# Patient Record
Sex: Female | Born: 1995 | Race: White | Hispanic: No | Marital: Single | Smoking: Never smoker
Health system: Southern US, Community
[De-identification: ages and names within clinical notes are randomized; demographics above are authoritative.]

## PROBLEM LIST (undated history)

## (undated) DIAGNOSIS — F419 Anxiety disorder, unspecified: Secondary | ICD-10-CM

## (undated) HISTORY — PX: NO PAST SURGERIES: SHX2092

---
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
## 2015-12-30 ENCOUNTER — Emergency Department: Payer: 59

## 2015-12-30 ENCOUNTER — Encounter: Payer: Self-pay | Admitting: Emergency Medicine

## 2015-12-30 ENCOUNTER — Observation Stay
Admission: EM | Admit: 2015-12-30 | Discharge: 2015-12-31 | Disposition: A | Discharge status: Home / Self Care | Payer: 59 | Attending: Internal Medicine | Admitting: Internal Medicine

## 2015-12-30 DIAGNOSIS — R05 Cough: Secondary | ICD-10-CM | POA: Insufficient documentation

## 2015-12-30 DIAGNOSIS — R079 Chest pain, unspecified: Secondary | ICD-10-CM | POA: Diagnosis not present

## 2015-12-30 DIAGNOSIS — F419 Anxiety disorder, unspecified: Secondary | ICD-10-CM | POA: Insufficient documentation

## 2015-12-30 DIAGNOSIS — R1012 Left upper quadrant pain: Secondary | ICD-10-CM | POA: Insufficient documentation

## 2015-12-30 DIAGNOSIS — R6510 Systemic inflammatory response syndrome (SIRS) of non-infectious origin without acute organ dysfunction: Principal | ICD-10-CM | POA: Diagnosis present

## 2015-12-30 DIAGNOSIS — D72829 Elevated white blood cell count, unspecified: Secondary | ICD-10-CM | POA: Diagnosis present

## 2015-12-30 DIAGNOSIS — R000 Tachycardia, unspecified: Secondary | ICD-10-CM | POA: Diagnosis present

## 2015-12-30 DIAGNOSIS — E860 Dehydration: Secondary | ICD-10-CM | POA: Diagnosis present

## 2015-12-30 DIAGNOSIS — R509 Fever, unspecified: Secondary | ICD-10-CM | POA: Diagnosis present

## 2015-12-30 DIAGNOSIS — R002 Palpitations: Secondary | ICD-10-CM | POA: Diagnosis not present

## 2015-12-30 HISTORY — DX: Anxiety disorder, unspecified: F41.9

## 2015-12-30 LAB — CBC WITH DIFFERENTIAL/PLATELET
BASOS ABS: 0 10*3/uL (ref 0–0.1)
BASOS PCT: 0 %
EOS ABS: 0.1 10*3/uL (ref 0–0.7)
EOS PCT: 1 %
HCT: 41 % (ref 35.0–47.0)
Hemoglobin: 13.8 g/dL (ref 12.0–16.0)
Lymphocytes Relative: 7 %
Lymphs Abs: 1 10*3/uL (ref 1.0–3.6)
MCH: 29.5 pg (ref 26.0–34.0)
MCHC: 33.7 g/dL (ref 32.0–36.0)
MCV: 87.7 fL (ref 80.0–100.0)
MONO ABS: 1.1 10*3/uL — AB (ref 0.2–0.9)
Monocytes Relative: 7 %
NEUTROS ABS: 13.5 10*3/uL — AB (ref 1.4–6.5)
Neutrophils Relative %: 85 %
PLATELETS: 240 10*3/uL (ref 150–440)
RBC: 4.68 MIL/uL (ref 3.80–5.20)
RDW: 13.3 % (ref 11.5–14.5)
WBC: 15.7 10*3/uL — ABNORMAL HIGH (ref 3.6–11.0)

## 2015-12-30 LAB — COMPREHENSIVE METABOLIC PANEL
ALT: 36 U/L (ref 14–54)
AST: 22 U/L (ref 15–41)
Albumin: 4.6 g/dL (ref 3.5–5.0)
Alkaline Phosphatase: 84 U/L (ref 38–126)
Anion gap: 10 (ref 5–15)
BUN: 12 mg/dL (ref 6–20)
CO2: 24 mmol/L (ref 22–32)
Calcium: 9.4 mg/dL (ref 8.9–10.3)
Chloride: 104 mmol/L (ref 101–111)
Creatinine, Ser: 0.68 mg/dL (ref 0.44–1.00)
GFR calc Af Amer: >60 mL/min (ref >60)
GFR calc non Af Amer: >60 mL/min (ref >60)
Glucose, Bld: 130 mg/dL — ABNORMAL HIGH (ref 65–99)
Potassium: 3.4 mmol/L — ABNORMAL LOW (ref 3.5–5.1)
Sodium: 138 mmol/L (ref 135–145)
Total Bilirubin: 0.8 mg/dL (ref 0.3–1.2)
Total Protein: 8 g/dL (ref 6.5–8.1)

## 2015-12-30 LAB — TROPONIN I

## 2015-12-30 LAB — POCT RAPID STREP A: STREPTOCOCCUS, GROUP A SCREEN (DIRECT): NEGATIVE

## 2015-12-30 LAB — MONONUCLEOSIS SCREEN: MONO SCREEN: NEGATIVE

## 2015-12-30 MED ORDER — DIPHENHYDRAMINE HCL 50 MG/ML IJ SOLN
25.0000 mg | Freq: Once | INTRAMUSCULAR | Status: AC
  Administered 2015-12-30: 25 mg via INTRAVENOUS
  Filled 2015-12-30: qty 1

## 2015-12-30 MED ORDER — SODIUM CHLORIDE 0.9 % IV BOLUS (SEPSIS)
1000.0000 mL | Freq: Once | INTRAVENOUS | Status: AC
  Administered 2015-12-30: 1000 mL via INTRAVENOUS

## 2015-12-30 MED ORDER — METOCLOPRAMIDE HCL 5 MG/ML IJ SOLN
10.0000 mg | Freq: Once | INTRAMUSCULAR | Status: AC
  Administered 2015-12-30: 10 mg via INTRAVENOUS
  Filled 2015-12-30: qty 2

## 2015-12-30 NOTE — ED Notes (Signed)
Pt also complains of chest pain.  °

## 2015-12-30 NOTE — ED Notes (Signed)
Patient transported to Ultrasound 

## 2015-12-30 NOTE — ED Notes (Addendum)
Pt states has had fever, achy joints since 1830 yesterday. Pt states has had sore throat. Pt with slightly pale skin, pt states took tylenol 1930, . Primary assessment normal with exception of sinus tachycardia.

## 2015-12-30 NOTE — ED Provider Notes (Signed)
[facility] Emergency Department Provider Note  ____________________________________________  Time seen: Approximately 2312 PM  I have reviewed the triage vital signs and the nursing notes.   HISTORY  Chief Complaint Fever and Tachycardia    HPI Erica Conrad is a 20 y.o. female who comes into the hospital today with raised heart rate, fever, chest pain and achy joints. The patient reports her symptoms started at 6 PM yesterday. The patient reports that she does with anxiety often and thought that her tachycardia was due to that but then she reports she had a temperature up to 101.7 at home. The patient has had no sick contacts and has been taking some Advil and Tylenol. The patient has not been eating or drinking much today. She is a cough that comes and goes and has some sinus issues and feels as though her throat and her nose are dry. The patient has a headache that she reports a 7 out of 10 in intensity. She last took Tylenol around 7:30 PM. The patient has had some nausea with some joint pains in her hips. She denies any problems with urination and has some mild right-sided neck pain. The patient some of these symptoms before. The patient reports that her chest pain is in the middle of her chest and radiates to the right shoulder. It is tender to palpation and does not get any worse when she takes a deep breath. The patient was unsure what was going on so she decided to come in to get checked out today.   Past Medical History  Diagnosis Date  . Anxiety     Patient Active Problem List   Diagnosis Date Noted  . SIRS (systemic inflammatory response syndrome) (HCC) 12/31/2015  . Dehydration 12/31/2015  . Leukocytosis 12/31/2015  . Tachycardia 12/31/2015    Past Surgical History  Procedure Laterality Date  . No past surgeries      No current outpatient prescriptions on file.  Allergies Review of patient's allergies indicates no known  allergies.  Family History  Problem Relation Age of Onset  . ADD / ADHD      Social History Social History  Substance Use Topics  . Smoking status: Never Smoker   . Smokeless tobacco: Never Used  . Alcohol Use: No    Review of Systems Constitutional:  fever/chills Eyes: No visual changes. ENT: Mild sore throat. Cardiovascular: chest pain. Respiratory: Cough but Denies shortness of breath. Gastrointestinal: No abdominal pain.  No nausea, no vomiting.  No diarrhea.  No constipation. Genitourinary: Negative for dysuria. Musculoskeletal: Negative for back pain. Skin: Negative for rash. Neurological: Headache  10-point ROS otherwise negative.  ____________________________________________   PHYSICAL EXAM:  VITAL SIGNS: ED Triage Vitals  Enc Vitals Group     BP 12/30/15 2043 119/80 mmHg     Pulse Rate 12/30/15 2043 136     Resp 12/30/15 2043 18     Temp 12/30/15 2043 98.9 F (37.2 C)     Temp Source 12/30/15 2043 Oral     SpO2 12/30/15 2043 100 %     Weight 12/30/15 2043 120 lb (54.432 kg)     Height 12/30/15 2043  (1.575 m)     Head Cir --      Peak Flow --      Pain Score 12/30/15 2043 6     Pain Loc --      Pain Edu? --      Excl. in GC? --  Constitutional: Alert and oriented. Well appearing and in moderate distress. Eyes: Conjunctivae are normal. PERRL. EOMI. Head: Atraumatic. Nose: No congestion/rhinnorhea. Neck: Supple with no meningismus, palpable anterior cervical lymphadenopathy. Mouth/Throat: Mucous membranes are moist.  Oropharynx non-erythematous. Cardiovascular: Tachycardic, regular rhythm. Grossly normal heart sounds.  Good peripheral circulation. Respiratory: Normal respiratory effort.  No retractions. Lungs CTAB. Gastrointestinal: Soft with left upper quadrant tenderness to palpation. No distention. Positive bowel sounds Musculoskeletal: No lower extremity tenderness nor edema.   Neurologic:  Normal speech and language.  Skin:  Skin is  warm, dry and intact. No rash noted. Psychiatric: Mood and affect are normal.   ____________________________________________   LABS (all labs ordered are listed, but only abnormal results are displayed)  Labs Reviewed  COMPREHENSIVE METABOLIC PANEL - Abnormal; Notable for the following:    Potassium 3.4 (*)    Glucose, Bld 130 (*)    All other components within normal limits  CBC WITH DIFFERENTIAL/PLATELET - Abnormal; Notable for the following:    WBC 15.7 (*)    Neutro Abs 13.5 (*)    Monocytes Absolute 1.1 (*)    All other components within normal limits  URINALYSIS COMPLETEWITH MICROSCOPIC ([facility] ONLY) - Abnormal; Notable for the following:    Color, Urine YELLOW (*)    APPearance CLOUDY (*)    Ketones, ur TRACE (*)    Hgb urine dipstick 3+ (*)    Bacteria, UA RARE (*)    Squamous Epithelial / LPF 6-30 (*)    All other components within normal limits  RAPID INFLUENZA A&B ANTIGENS ([facility] ONLY)  CULTURE, GROUP A STREP ([facility])  CULTURE, BLOOD (ROUTINE X 2)  CULTURE, BLOOD (ROUTINE X 2)  TROPONIN I  MONONUCLEOSIS SCREEN  PREGNANCY, URINE  BRAIN NATRIURETIC PEPTIDE  TROPONIN I  TSH  METANEPHRINES, PLASMA  POCT RAPID STREP A  POCT PREGNANCY, URINE   ____________________________________________  EKG  ED ECG REPORT I, Rebecka Apley, the attending physician, personally viewed and interpreted this ECG.   Date: 12/30/2015  EKG Time: 2040  Rate: 134  Rhythm: sinus tachycardia  Axis: Right axis  Intervals:none  ST&T Change: Flipped T-wave in lead III and aVF  ____________________________________________  RADIOLOGY  CXR: Negative chest ____________________________________________   PROCEDURES  Procedure(s) performed: None  Critical Care performed: No  ____________________________________________   INITIAL IMPRESSION / ASSESSMENT AND PLAN / ED COURSE  Pertinent labs & imaging results that were available during my care of the patient were reviewed by  me and considered in my medical decision making (see chart for details).  This is a 20 -year-old female who comes into the hospital today with tachycardia and fever. The patient also has been having some chest pain that is tender to palpation of her anterior chest. The patient had some left upper quadrant pain which made me concerned about mononucleosis. I will check a Monospot as well as do an ultrasound of the patient's left upper quadrant evaluate for splenic injury. I will give the patient a second liter of normal saline as well as some Reglan and Benadryl for her headache. I will reassess the patient once she's had this medication and I received the mono results. I will also tested patient for flu.  The patient's blood work is all unremarkable. She remains tachycardic while here in the emergency department. She received 3 L of normal saline and still had persistent tachycardia. She did have a fever so I gave her some more Tylenol. I will admit the patient to the hospitalist  service for further evaluation given her persistent tachycardia at this time. ____________________________________________   FINAL CLINICAL IMPRESSION(S) / ED DIAGNOSES  Final diagnoses:  Fever, unspecified fever cause  Tachycardia      Rebecka Apley, MD 12/31/15 [phone]

## 2015-12-30 NOTE — ED Notes (Signed)
Dr. Darnelle Catalan notified regarding pt with tachycardia and fever at home. Orders received.

## 2015-12-31 ENCOUNTER — Observation Stay (HOSPITAL_BASED_OUTPATIENT_CLINIC_OR_DEPARTMENT_OTHER)
Admit: 2015-12-31 | Discharge: 2015-12-31 | Disposition: A | Discharge status: Home / Self Care | Payer: 59 | Attending: Internal Medicine | Admitting: Internal Medicine

## 2015-12-31 ENCOUNTER — Encounter: Payer: Self-pay | Admitting: Internal Medicine

## 2015-12-31 DIAGNOSIS — R6510 Systemic inflammatory response syndrome (SIRS) of non-infectious origin without acute organ dysfunction: Secondary | ICD-10-CM | POA: Diagnosis present

## 2015-12-31 DIAGNOSIS — R509 Fever, unspecified: Secondary | ICD-10-CM

## 2015-12-31 DIAGNOSIS — R000 Tachycardia, unspecified: Secondary | ICD-10-CM | POA: Diagnosis present

## 2015-12-31 DIAGNOSIS — D72829 Elevated white blood cell count, unspecified: Secondary | ICD-10-CM | POA: Diagnosis present

## 2015-12-31 DIAGNOSIS — E860 Dehydration: Secondary | ICD-10-CM | POA: Diagnosis present

## 2015-12-31 LAB — POCT PREGNANCY, URINE: Preg Test, Ur: NEGATIVE

## 2015-12-31 LAB — TSH: TSH: 3 u[IU]/mL (ref 0.350–4.500)

## 2015-12-31 LAB — URINALYSIS COMPLETE WITH MICROSCOPIC (ARMC ONLY)
BILIRUBIN URINE: NEGATIVE
GLUCOSE, UA: NEGATIVE mg/dL
Leukocytes, UA: NEGATIVE
NITRITE: NEGATIVE
Protein, ur: NEGATIVE mg/dL
SPECIFIC GRAVITY, URINE: 1.012 (ref 1.005–1.030)
pH: 5 (ref 5.0–8.0)

## 2015-12-31 LAB — PREGNANCY, URINE: Preg Test, Ur: NEGATIVE

## 2015-12-31 LAB — BRAIN NATRIURETIC PEPTIDE: B NATRIURETIC PEPTIDE 5: 18 pg/mL (ref 0.0–100.0)

## 2015-12-31 LAB — RAPID INFLUENZA A&B ANTIGENS
Influenza A (ARMC): NOT DETECTED
Influenza B (ARMC): NOT DETECTED

## 2015-12-31 LAB — TROPONIN I

## 2015-12-31 MED ORDER — ONDANSETRON HCL 4 MG PO TABS: 4.0000 mg | ORAL_TABLET | Freq: Four times a day (QID) | ORAL | Status: DC | PRN

## 2015-12-31 MED ORDER — ONDANSETRON HCL 4 MG/2ML IJ SOLN
4.0000 mg | Freq: Four times a day (QID) | INTRAMUSCULAR | Status: DC | PRN
Start: 2015-12-31 — End: 2015-12-31

## 2015-12-31 MED ORDER — ACETAMINOPHEN 325 MG PO TABS
650.0000 mg | ORAL_TABLET | Freq: Four times a day (QID) | ORAL | Status: DC | PRN
  Administered 2015-12-31: 650 mg via ORAL
  Filled 2015-12-31: qty 2

## 2015-12-31 MED ORDER — KETOROLAC TROMETHAMINE 30 MG/ML IJ SOLN
30.0000 mg | Freq: Once | INTRAMUSCULAR | Status: AC
  Administered 2015-12-31: 30 mg via INTRAVENOUS
  Filled 2015-12-31: qty 1

## 2015-12-31 MED ORDER — ACETAMINOPHEN 650 MG RE SUPP: 650.0000 mg | Freq: Four times a day (QID) | RECTAL | Status: DC | PRN

## 2015-12-31 MED ORDER — SODIUM CHLORIDE 0.9 % IV BOLUS (SEPSIS)
1000.0000 mL | Freq: Once | INTRAVENOUS | Status: AC
  Administered 2015-12-31: 1000 mL via INTRAVENOUS

## 2015-12-31 MED ORDER — ACETAMINOPHEN 500 MG PO TABS
1000.0000 mg | ORAL_TABLET | Freq: Once | ORAL | Status: AC
  Administered 2015-12-31: 1000 mg via ORAL
  Filled 2015-12-31: qty 2

## 2015-12-31 MED ORDER — SODIUM CHLORIDE 0.9 % IV SOLN
INTRAVENOUS | Status: DC
  Administered 2015-12-31: 05:00:00 via INTRAVENOUS

## 2015-12-31 MED ORDER — SODIUM CHLORIDE 0.9% FLUSH: 3.0000 mL | Freq: Two times a day (BID) | INTRAVENOUS | Status: DC

## 2015-12-31 NOTE — Discharge Summary (Signed)
[facility] - [place] at [facility]   PATIENT NAME: Erica Conrad    MR#:  [id number]  DATE OF BIRTH:  [birth year]  DATE OF ADMISSION:  12/30/2015 ADMITTING PHYSICIAN: Oralia Manis, MD  DATE OF DISCHARGE: 12/31/2015 PRIMARY CARE PHYSICIAN: NOGO,ADNAN, MD    ADMISSION DIAGNOSIS:  Tachycardia [R00.0] Fever, unspecified fever cause [R50.9]  DISCHARGE DIAGNOSIS:  Principal Problem:   SIRS (systemic inflammatory response syndrome) (HCC) Active Problems:   Dehydration   Leukocytosis   Tachycardia   SECONDARY DIAGNOSIS:   Past Medical History  Diagnosis Date  . Anxiety     HOSPITAL COURSE:   20 year old female with anxiety who presented with fever and tachycardia. For further details please of age.  1. Fever: This is due to viral syndrome. Follicles and are negative to date.  2. Sinus tachycardia: This is secondary to fever and dehydration. Heart rates have improved. TSH was within normal limits. I canceled cardiology consultation as her sinus tachycardia was due to fever and dehydration.   3. Dehydration: Patient was provided IV fluids.   DISCHARGE CONDITIONS AND DIET:   Patient was stable for discharge home  Regular diet  CONSULTS OBTAINED:     DRUG ALLERGIES:  No Known Allergies  DISCHARGE MEDICATIONS:   Current Discharge Medication List    CONTINUE these medications which have NOT CHANGED   Details  acetaminophen (TYLENOL) 500 MG tablet Take 500 mg by mouth every 6 (six) hours as needed.              Today   CHIEF COMPLAINT:  Patient has improved with her weakness and feeling of palpitation. She feels as though the IV fluids held.   VITAL SIGNS:  Blood pressure 97/62, pulse 120, temperature 99.1 F (37.3 C), temperature source Oral, resp. rate 22, height  (1.575 m), weight 59.058 kg (130 lb 3.2 oz), last menstrual period 12/27/2015, SpO2 95 %.   REVIEW OF SYSTEMS:  Review of Systems  Constitutional: Negative  for fever, chills and malaise/fatigue.  HENT: Negative for sore throat.   Eyes: Negative for blurred vision.  Respiratory: Negative for cough, hemoptysis, shortness of breath and wheezing.   Cardiovascular: Negative for chest pain, palpitations and leg swelling.  Gastrointestinal: Negative for nausea, vomiting, abdominal pain, diarrhea and blood in stool.  Genitourinary: Negative for dysuria.  Musculoskeletal: Negative for back pain.  Neurological: Negative for dizziness, tremors and headaches.  Endo/Heme/Allergies: Does not bruise/bleed easily.     PHYSICAL EXAMINATION:  GENERAL:  20 y.o.-year-old patient lying in the bed with no acute distress.  NECK:  Supple, no jugular venous distention. No thyroid enlargement, no tenderness.  LUNGS: Normal breath sounds bilaterally, no wheezing, rales,rhonchi  No use of accessory muscles of respiration.  CARDIOVASCULAR: sinus tachycardia. No murmurs, rubs, or gallops.  ABDOMEN: Soft, non-tender, non-distended. Bowel sounds present. No organomegaly or mass.  EXTREMITIES: No pedal edema, cyanosis, or clubbing.  PSYCHIATRIC: The patient is alert and oriented x 3.  SKIN: No obvious rash, lesion, or ulcer.   DATA REVIEW:   CBC  Recent Labs Lab 12/30/15 2053  WBC 15.7*  HGB 13.8  HCT 41.0  PLT 240    Chemistries   Recent Labs Lab 12/30/15 2053  NA 138  K 3.4*  CL 104  CO2 24  GLUCOSE 130*  BUN 12  CREATININE 0.68  CALCIUM 9.4  AST 22  ALT 36  ALKPHOS 84  BILITOT 0.8    Cardiac Enzymes  Recent Labs Lab 12/30/15 2053  12/31/15 0117  TROPONINI <0.03 <0.03    Microbiology Results  @  RADIOLOGY:  Dg Chest 2 View  12/30/2015  CLINICAL DATA:  Fever, cough, chest pain EXAM: CHEST  2 VIEW COMPARISON:  None. FINDINGS: Normal heart size and mediastinal contours. No acute infiltrate or edema. No effusion or pneumothorax. No osseous findings. IMPRESSION: Negative chest. Electronically Signed   By: Marnee Spring M.D.    On: 12/30/2015 22:35   US Abdomen Limited  12/31/2015  CLINICAL DATA:  Left upper quadrant pain. Possible mononucleosis. Evaluate spleen. EXAM: LIMITED ABDOMINAL ULTRASOUND COMPARISON:  None. FINDINGS: Splenic size is 9 x 12 x 5 cm for a 267 cc volume. There is no focal abnormality or surrounding fluid. Normal hilar Doppler flow. IMPRESSION: Normal spleen. Electronically Signed   By: Marnee Spring M.D.   On: 12/31/2015 00:19      Management plans discussed with the patient and she is in agreement. Stable for discharge  home  Patient should follow up with  PCP 1 week  CODE STATUS:     Code Status Orders        Start     Ordered   12/31/15 0452  Full code   Continuous     12/31/15 0451    Code Status History    Date Active Date Inactive Code Status Order ID Comments User Context   This patient has a current code status but no historical code status.      TOTAL TIME TAKING CARE OF THIS PATIENT:  35 minutes.    Note: This dictation was prepared with Dragon dictation along with smaller phrase technology. Any transcriptional errors that result from this process are unintentional.  Avyon Herendeen M.D on 12/31/2015 at 10:34 AM  Between 7am to 6pm - Pager - [phone] After 6pm go to [email or web address] - password [email or web address] [facility]  [place] [facility]  Office  [phone]  CC: Primary care physician; Donata Duff, MD

## 2015-12-31 NOTE — H&P (Signed)
[facility] - [place] at [facility]   PATIENT NAME: Erica Conrad    MR#:  [id number]  DATE OF BIRTH:  Mar 30, 1996  DATE OF ADMISSION:  12/30/2015  PRIMARY CARE PHYSICIAN: Donata Duff, MD   REQUESTING/REFERRING PHYSICIAN: Zenda Alpers, MD  CHIEF COMPLAINT:   Chief Complaint  Patient presents with  . Fever  . Tachycardia    HISTORY OF PRESENT ILLNESS:  Erica Conrad  is a 20 y.o. female who presents with fever and tachycardia. Patient states that she first developed symptoms just over  24 hours ago. She woke up initially uncomfortable from sleep, and thinks she may have had a fever at that time. She did not mention anything to her parents. However, she went back to bed and slept most of the day. When she woke up in the evening her parents checked her temperature and noted to have a fever 101.7. They brought her into the ED for evaluation. She also states that she has had achy joints and general malaise. Here in the ED she was found to be tachycardic, with a sinus tachycardia and a rate of 130 bpm. She was also found to have an elevated white count at 15. However, other infectious workup is largely negative. Chest x-ray shows no pneumonia. UA shows only too numerous to count red cells, the patient states she is menstruating currently. Viral workup also negative, specifically flu negative, Monospot negative. She is also rapid strep negative. Patient does state that she has always had a fast heart rate. She states that even as a young child she would participate in exercise activities, and that she would have persistent palpitations with some shortness of breath for hours afterwards. She was evaluated at some point in her PCPs office with an EKG, which showed sinus rhythm at that time. No further workup was pursued. However, she states that even now she is limited in her exercise ability due to her persistently elevated heart rate after she exercises. She gives no history of  excessive caffeine intake or other substances. She was given significant fluid resuscitation in the ED, but remains persistently tachycardic. Hospitalists were called for further evaluation and admission.   PAST MEDICAL HISTORY:   Past Medical History  Diagnosis Date  . Anxiety     PAST SURGICAL HISTORY:   Past Surgical History  Procedure Laterality Date  . No past surgeries      SOCIAL HISTORY:   Social History  Substance Use Topics  . Smoking status: Never Smoker   . Smokeless tobacco: Never Used  . Alcohol Use: No    FAMILY HISTORY:   Family History  Problem Relation Age of Onset  . ADD / ADHD      DRUG ALLERGIES:  No Known Allergies  MEDICATIONS AT HOME:   Prior to Admission medications   Medication Sig Start Date End Date Taking? Authorizing Provider  acetaminophen (TYLENOL) 500 MG tablet Take 500 mg by mouth every 6 (six) hours as needed.   Yes Historical Provider, MD    REVIEW OF SYSTEMS:  Review of Systems  Constitutional: Positive for fever and malaise/fatigue. Negative for chills and weight loss.  HENT: Negative for ear pain, hearing loss and tinnitus.   Eyes: Negative for blurred vision, double vision, pain and redness.  Respiratory: Negative for cough, hemoptysis and shortness of breath.   Cardiovascular: Positive for palpitations. Negative for chest pain, orthopnea and leg swelling.  Gastrointestinal: Negative for nausea, vomiting, abdominal pain, diarrhea and constipation.  Genitourinary: Negative for  dysuria, frequency and hematuria.  Musculoskeletal: Positive for joint pain (diffuse). Negative for back pain and neck pain.  Skin:       No acne, rash, or lesions  Neurological: Negative for dizziness, tremors, focal weakness and weakness.  Endo/Heme/Allergies: Negative for polydipsia. Does not bruise/bleed easily.  Psychiatric/Behavioral: Negative for depression. The patient is nervous/anxious. The patient does not have insomnia.      VITAL  SIGNS:   Filed Vitals:   12/31/15 0128 12/31/15 0223 12/31/15 0312 12/31/15 0354  BP: 112/75 110/82 109/67 123/73  Pulse: 128 132 128 120  Temp: 99.8 F (37.7 C) 101.7 F (38.7 C) 102 F (38.9 C) 99.7 F (37.6 C)  TempSrc: Oral Oral Oral Oral  Resp: Height:      Weight:      SpO2: 97% 100% 97% 99%   Wt Readings from Last 3 Encounters:  12/30/15 54.432 kg (120 lb) (34 %*, Z = -0.42)   * Growth percentiles are based on CDC 2-20 Years data.    PHYSICAL EXAMINATION:  Physical Exam  Vitals reviewed. Constitutional: She is oriented to person, place, and time. She appears well-developed and well-nourished. No distress.  HENT:  Head: Normocephalic and atraumatic.  Mouth/Throat: Oropharynx is clear and moist.  Eyes: Conjunctivae and EOM are normal. Pupils are equal, round, and reactive to light. No scleral icterus.  Neck: Normal range of motion. Neck supple. No JVD present. No thyromegaly present.  Cardiovascular: Regular rhythm and intact distal pulses.  Exam reveals no gallop and no friction rub.   No murmur heard. Tachycardic  Respiratory: Effort normal and breath sounds normal. No respiratory distress. She has no wheezes. She has no rales.  GI: Soft. Bowel sounds are normal. She exhibits no distension. There is no tenderness.  Musculoskeletal: Normal range of motion. She exhibits no edema.  No arthritis, no gout  Lymphadenopathy:    She has no cervical adenopathy.  Neurological: She is alert and oriented to person, place, and time. No cranial nerve deficit.  No dysarthria, no aphasia  Skin: Skin is warm and dry. No rash noted. No erythema.  Psychiatric: She has a normal mood and affect. Her behavior is normal. Judgment and thought content normal.    LABORATORY PANEL:   CBC  Recent Labs Lab 12/30/15 2053  WBC 15.7*  HGB 13.8  HCT 41.0  PLT 240    ------------------------------------------------------------------------------------------------------------------  Chemistries   Recent Labs Lab 12/30/15 2053  NA 138  K 3.4*  CL 104  CO2 24  GLUCOSE 130*  BUN 12  CREATININE 0.68  CALCIUM 9.4  AST 22  ALT 36  ALKPHOS 84  BILITOT 0.8   ------------------------------------------------------------------------------------------------------------------  Cardiac Enzymes  Recent Labs Lab 12/31/15 0117  TROPONINI <0.03   ------------------------------------------------------------------------------------------------------------------  RADIOLOGY:  Dg Chest 2 View  12/30/2015  CLINICAL DATA:  Fever, cough, chest pain EXAM: CHEST  2 VIEW COMPARISON:  None. FINDINGS: Normal heart size and mediastinal contours. No acute infiltrate or edema. No effusion or pneumothorax. No osseous findings. IMPRESSION: Negative chest. Electronically Signed   By: Marnee Spring M.D.   On: 12/30/2015 22:35   US Abdomen Limited  12/31/2015  CLINICAL DATA:  Left upper quadrant pain. Possible mononucleosis. Evaluate spleen. EXAM: LIMITED ABDOMINAL ULTRASOUND COMPARISON:  None. FINDINGS: Splenic size is 9 x 12 x 5 cm for a 267 cc volume. There is no focal abnormality or surrounding fluid. Normal hilar Doppler flow. IMPRESSION: Normal spleen. Electronically Signed   By: Marja Kays  Watts M.D.   On: 12/31/2015 00:19    EKG:   Orders placed or performed during the hospital encounter of 12/30/15  . EKG 12-Lead  . EKG 12-Lead  . ED EKG within 10 minutes  . ED EKG within 10 minutes    IMPRESSION AND PLAN:  Principal Problem:   SIRS (systemic inflammatory response syndrome) (HCC) - low suspicion for bacterial infection at this time. However, we'll send blood cultures. Suspect this is more likely viral syndrome. Active Problems:   Dehydration - patient was initially fluid rehydrated well in the ED. On examination of this writer moist mucosal membranes, good  capillary refill. UA showed specific gravity 1.012, indicating decent fluid status. We'll continue with IV maintenance fluids for the next several hours. Encourage by mouth intake.   Leukocytosis - likely reactive due to whatever infectious process she has going on, suspect viral etiology.   Tachycardia -  get echocardiogram, check TSH, check serum and urine metanephrines, cardiology consult. Monitor on telemetry.   All the records are reviewed and case discussed with ED provider. Management plans discussed with the patient and/or family.  DVT PROPHYLAXIS: Mechanical only  GI PROPHYLAXIS: None  ADMISSION STATUS: Observation  CODE STATUS: Full Code Status History    This patient does not have a recorded code status. Please follow your organizational policy for patients in this situation.      TOTAL TIME TAKING CARE OF THIS PATIENT: 45 minutes.    Nivek Powley FIELDING 12/31/2015, 3:59 AM  [facility]  Office  [phone]  CC: Primary care physician; Donata Duff, MD

## 2015-12-31 NOTE — ED Notes (Signed)
Pt returned to room, unable to void

## 2015-12-31 NOTE — Progress Notes (Signed)
*  PRELIMINARY RESULTS* Echocardiogram 2D Echocardiogram has been performed.  Erica Conrad 12/31/2015, 8:56 AM

## 2016-01-01 LAB — CULTURE, GROUP A STREP (THRC)

## 2016-01-02 LAB — METANEPHRINES, PLASMA

## 2016-01-05 LAB — CULTURE, BLOOD (ROUTINE X 2)
CULTURE: NO GROWTH
Culture: NO GROWTH

## 2016-03-11 ENCOUNTER — Emergency Department
Admission: EM | Admit: 2016-03-11 | Discharge: 2016-03-11 | Disposition: A | Discharge status: Home / Self Care | Payer: 59 | Attending: Emergency Medicine | Admitting: Emergency Medicine

## 2016-03-11 ENCOUNTER — Encounter: Payer: Self-pay | Admitting: Emergency Medicine

## 2016-03-11 ENCOUNTER — Emergency Department: Payer: 59

## 2016-03-11 DIAGNOSIS — R945 Abnormal results of liver function studies: Secondary | ICD-10-CM | POA: Insufficient documentation

## 2016-03-11 DIAGNOSIS — B349 Viral infection, unspecified: Secondary | ICD-10-CM

## 2016-03-11 DIAGNOSIS — B09 Unspecified viral infection characterized by skin and mucous membrane lesions: Secondary | ICD-10-CM | POA: Insufficient documentation

## 2016-03-11 DIAGNOSIS — R509 Fever, unspecified: Secondary | ICD-10-CM | POA: Diagnosis present

## 2016-03-11 LAB — CBC
HEMATOCRIT: 36.3 % (ref 35.0–47.0)
HEMOGLOBIN: 12.6 g/dL (ref 12.0–16.0)
MCH: 29.4 pg (ref 26.0–34.0)
MCHC: 34.6 g/dL (ref 32.0–36.0)
MCV: 85.1 fL (ref 80.0–100.0)
PLATELETS: 171 10*3/uL (ref 150–440)
RBC: 4.27 MIL/uL (ref 3.80–5.20)
RDW: 14.3 % (ref 11.5–14.5)
WBC: 11.5 10*3/uL — ABNORMAL HIGH (ref 3.6–11.0)

## 2016-03-11 LAB — URINALYSIS COMPLETE WITH MICROSCOPIC (ARMC ONLY)
Bilirubin Urine: NEGATIVE
GLUCOSE, UA: NEGATIVE mg/dL
HGB URINE DIPSTICK: NEGATIVE
Ketones, ur: NEGATIVE mg/dL
Leukocytes, UA: NEGATIVE
NITRITE: NEGATIVE
PROTEIN: NEGATIVE mg/dL
RBC / HPF: NONE SEEN RBC/hpf (ref 0–5)
Specific Gravity, Urine: 1.004 — ABNORMAL LOW (ref 1.005–1.030)
pH: 6 (ref 5.0–8.0)

## 2016-03-11 LAB — RAPID INFLUENZA A&B ANTIGENS (ARMC ONLY): INFLUENZA A (ARMC): NEGATIVE

## 2016-03-11 LAB — COMPREHENSIVE METABOLIC PANEL
ALBUMIN: 3.9 g/dL (ref 3.5–5.0)
ALT: 441 U/L — ABNORMAL HIGH (ref 14–54)
ANION GAP: 8 (ref 5–15)
AST: 250 U/L — AB (ref 15–41)
Alkaline Phosphatase: 149 U/L — ABNORMAL HIGH (ref 38–126)
BILIRUBIN TOTAL: 1.3 mg/dL — AB (ref 0.3–1.2)
BUN: 5 mg/dL — AB (ref 6–20)
CHLORIDE: 98 mmol/L — AB (ref 101–111)
CO2: 26 mmol/L (ref 22–32)
Calcium: 8.8 mg/dL — ABNORMAL LOW (ref 8.9–10.3)
Creatinine, Ser: 0.67 mg/dL (ref 0.44–1.00)
GFR calc Af Amer: >60 mL/min (ref >60)
GFR calc non Af Amer: >60 mL/min (ref >60)
GLUCOSE: 107 mg/dL — AB (ref 65–99)
POTASSIUM: 3.4 mmol/L — AB (ref 3.5–5.1)
SODIUM: 132 mmol/L — AB (ref 135–145)
Total Protein: 7.6 g/dL (ref 6.5–8.1)

## 2016-03-11 LAB — RAPID INFLUENZA A&B ANTIGENS: Influenza B (ARMC): NEGATIVE

## 2016-03-11 LAB — TSH: TSH: 2.987 u[IU]/mL (ref 0.350–4.500)

## 2016-03-11 LAB — MONONUCLEOSIS SCREEN: Mono Screen: NEGATIVE

## 2016-03-11 LAB — LIPASE, BLOOD: Lipase: 19 U/L (ref 11–51)

## 2016-03-11 MED ORDER — AZITHROMYCIN 250 MG PO TABS: ORAL_TABLET | ORAL | Status: AC

## 2016-03-11 MED ORDER — SODIUM CHLORIDE 0.9 % IV BOLUS (SEPSIS)
1000.0000 mL | Freq: Once | INTRAVENOUS | Status: AC
  Administered 2016-03-11: 1000 mL via INTRAVENOUS

## 2016-03-11 NOTE — Discharge Instructions (Signed)
Viral Infections A viral infection can be caused by different types of viruses.Most viral infections are not serious and resolve on their own. However, some infections may cause severe symptoms and may lead to further complications. SYMPTOMS Viruses can frequently cause:  Minor sore throat.  Aches and pains.  Headaches.  Runny nose.  Different types of rashes.  Watery eyes.  Tiredness.  Cough.  Loss of appetite.  Gastrointestinal infections, resulting in nausea, vomiting, and diarrhea. These symptoms do not respond to antibiotics because the infection is not caused by bacteria. However, you might catch a bacterial infection following the viral infection. This is sometimes called a "superinfection." Symptoms of such a bacterial infection may include:  Worsening sore throat with pus and difficulty swallowing.  Swollen neck glands.  Chills and a high or persistent fever.  Severe headache.  Tenderness over the sinuses.  Persistent overall ill feeling (malaise), muscle aches, and tiredness (fatigue).  Persistent cough.  Yellow, green, or brown mucus production with coughing. HOME CARE INSTRUCTIONS   Only take over-the-counter or prescription medicines for pain, discomfort, diarrhea, or fever as directed by your caregiver.  Drink enough water and fluids to keep your urine clear or pale yellow. Sports drinks can provide valuable electrolytes, sugars, and hydration.  Get plenty of rest and maintain proper nutrition. Soups and broths with crackers or rice are fine. SEEK IMMEDIATE MEDICAL CARE IF:   You have severe headaches, shortness of breath, chest pain, neck pain, or an unusual rash.  You have uncontrolled vomiting, diarrhea, or you are unable to keep down fluids.  You or your child has an oral temperature above 102 F (38.9 C), not controlled by medicine.  Your baby is older than 3 months with a rectal temperature of 102 F (38.9 C) or higher.  Your baby is 463  months old or younger with a rectal temperature of 100.4 F (38 C) or higher. MAKE SURE YOU:   Understand these instructions.  Will watch your condition.  Will get help right away if you are not doing well or get worse.   This information is not intended to replace advice given to you by your health care provider. Make sure you discuss any questions you have with your health care provider.   Document Released: 08/21/2005 Document Revised: 02/03/2012 Document Reviewed: 04/19/2015 Elsevier Interactive Patient Education [employer]2016 [employer]  Please return immediately if condition worsens. Please contact her primary physician or the physician you were given for referral. If you have any specialist physicians involved in her treatment and plan please also contact them. Thank you for using [facility].

## 2016-03-11 NOTE — ED Notes (Signed)
Pt presents to ED after being seen at [facility] and recommended she come to ED. Pt reports fever and rapid heart rate. Pt reports cough. Pt denies nausea, vomiting and diarrhea.

## 2016-03-11 NOTE — ED Provider Notes (Signed)
Time Seen: Approximately 1700  I have reviewed the triage notes  Chief Complaint: Fever and Tachycardia   History of Present Illness: Erica Conrad is a 20 y.o. female *who has a recent history of fever for 3 days. She's had a wet sounding nonproductive cough. Patient apparently develops tachycardia whenever she has a fever and had an extensive evaluation including an echocardiogram back in February for tachycardia. Endocrine and echocardiogram studies were negative. The patient states no other symptoms such as chest pain, abdominal pain. She has noticed a light colored rash on the posterior surface of both her hands and her feet which she states she had the last time that she had a fever. She denies any nausea or vomiting. She is not aware of any tick bites or recent tick borne exposure.   Past Medical History  Diagnosis Date  . Anxiety     Patient Active Problem List   Diagnosis Date Noted  . SIRS (systemic inflammatory response syndrome) (HCC) 12/31/2015  . Dehydration 12/31/2015  . Leukocytosis 12/31/2015  . Tachycardia 12/31/2015    Past Surgical History  Procedure Laterality Date  . No past surgeries      Past Surgical History  Procedure Laterality Date  . No past surgeries      Current Outpatient Rx  Name  Route  Sig  Dispense  Refill  . acetaminophen (TYLENOL) 500 MG tablet   Oral   Take 500 mg by mouth every 6 (six) hours as needed.           Allergies:  Review of patient's allergies indicates no known allergies.  Family History: Family History  Problem Relation Age of Onset  . ADD / ADHD      Social History: Social History  Substance Use Topics  . Smoking status: Never Smoker   . Smokeless tobacco: Never Used  . Alcohol Use: No     Review of Systems:   10 point review of systems was performed and was otherwise negative:  Constitutional:Fever mentioned above Eyes: No visual disturbances ENT: No sore throat, ear pain Cardiac: No chest  pain Respiratory: No shortness of breath, wheezing, or stridor Abdomen: No abdominal pain, no vomiting, No diarrhea Endocrine: No weight loss, No night sweats Extremities: No peripheral edema, cyanosis Skin: No easy bruising Neurologic: No focal weakness, trouble with speech or swollowing Urologic: No dysuria, Hematuria, or urinary frequency   Physical Exam:  ED Triage Vitals  Enc Vitals Group     BP 03/11/16 1610 121/76 mmHg     Pulse Rate 03/11/16 1610 123     Resp 03/11/16 1610 18     Temp 03/11/16 1610 99 F (37.2 C)     Temp Source 03/11/16 1610 Oral     SpO2 03/11/16 1610 95 %     Weight 03/11/16 1610 124 lb (56.246 kg)     Height 03/11/16 1610 5\' 2"  (1.575 m)     Head Cir --      Peak Flow --      Pain Score 03/11/16 1610 7     Pain Loc --      Pain Edu? --      Excl. in GC? --     General: Awake , Alert , and Oriented times 3; GCS 15 Head: Normal cephalic , atraumatic Eyes: Pupils equal , round, reactive to light Nose/Throat: No nasal drainage, patent upper airway without erythema or exudate.  Neck: Supple, Full range of motion, No anterior adenopathy or palpable thyroid  masses Lungs: Clear to ascultation without wheezes , rhonchi, or rales Heart: Tachycardic, regular rhythm without murmurs , gallops , or rubs Abdomen: Soft, non tender without rebound, guarding , or rigidity; bowel sounds positive and symmetric in all 4 quadrants. No organomegaly .        Extremities: 2 plus symmetric pulses. No edema, clubbing or cyanosis Neurologic: normal ambulation, Motor symmetric without deficits, sensory intact Skin: warm, dry, no rashes   Labs:   All laboratory work was reviewed including any pertinent negatives or positives listed below:  Labs Reviewed  COMPREHENSIVE METABOLIC PANEL - Abnormal; Notable for the following:    Sodium 132 (*)    Potassium 3.4 (*)    Chloride 98 (*)    Glucose, Bld 107 (*)    BUN 5 (*)    Calcium 8.8 (*)    AST 250 (*)    ALT 441  (*)    Alkaline Phosphatase 149 (*)    Total Bilirubin 1.3 (*)    All other components within normal limits  CBC - Abnormal; Notable for the following:    WBC 11.5 (*)    All other components within normal limits  CULTURE, BLOOD (ROUTINE X 2)  CULTURE, BLOOD (ROUTINE X 2)  RAPID INFLUENZA A&B ANTIGENS ([facility] ONLY)  LIPASE, BLOOD  URINALYSIS COMPLETEWITH MICROSCOPIC ([facility] ONLY)  TSH   Patient's LFTs are slightly elevated with a white blood cell count of 11.5   Radiology:      EXAM: CHEST 2 VIEW  COMPARISON: December 30, 2015  FINDINGS: Lungs are clear. Heart size and pulmonary vascularity are normal. No adenopathy. No bone lesions.  IMPRESSION: No edema or consolidation.   Electronically Signed  I personally reviewed the radiologic studies     ED Course:  Patient's stay here was uneventful and she responded well to IV fluids and was initiated on a fever workup. Her functions are slightly elevated though she has no abdominal pain and I don't feel that this is primary liver disease. I doubt its side hepatitis, etc. at this point the Monospot is negative but she seems to present with some form of viral exanthem and with a fever of felt we could treat on an outpatient basis. Since been advised drink plenty of fluids and she was given a work note. She denies any tick born exposure. She has no hepatitis risk factors.    Assessment:  Viral exanthem Elevated LFTs of unknown etiology Fever of unknown etiology     Plan: Outpatient management Patient was advised to return immediately if condition worsens. Patient was advised to follow up with their primary care physician or other specialized physicians involved in their outpatient care. The patient and/or family member/power of attorney had laboratory results reviewed at the bedside. All questions and concerns were addressed and appropriate discharge instructions were distributed by the nursing  staff.             Jennye Moccasin, MD 03/11/16 [phone]

## 2016-03-11 NOTE — ED Notes (Signed)
Pt was seen at urgent care today and was sent to er for eval.  Pt has cough, fever ,bodyaches.  Nonsmoker.  Sx for 3-4 days.

## 2017-01-12 IMAGING — CR DG CHEST 2V
2 series · 2 of 2 positions shown · non-contrast
Comparison: December 30, 2015

CLINICAL DATA: Cough and fever for 4 days

EXAM:
CHEST  2 VIEW

[chest pa]
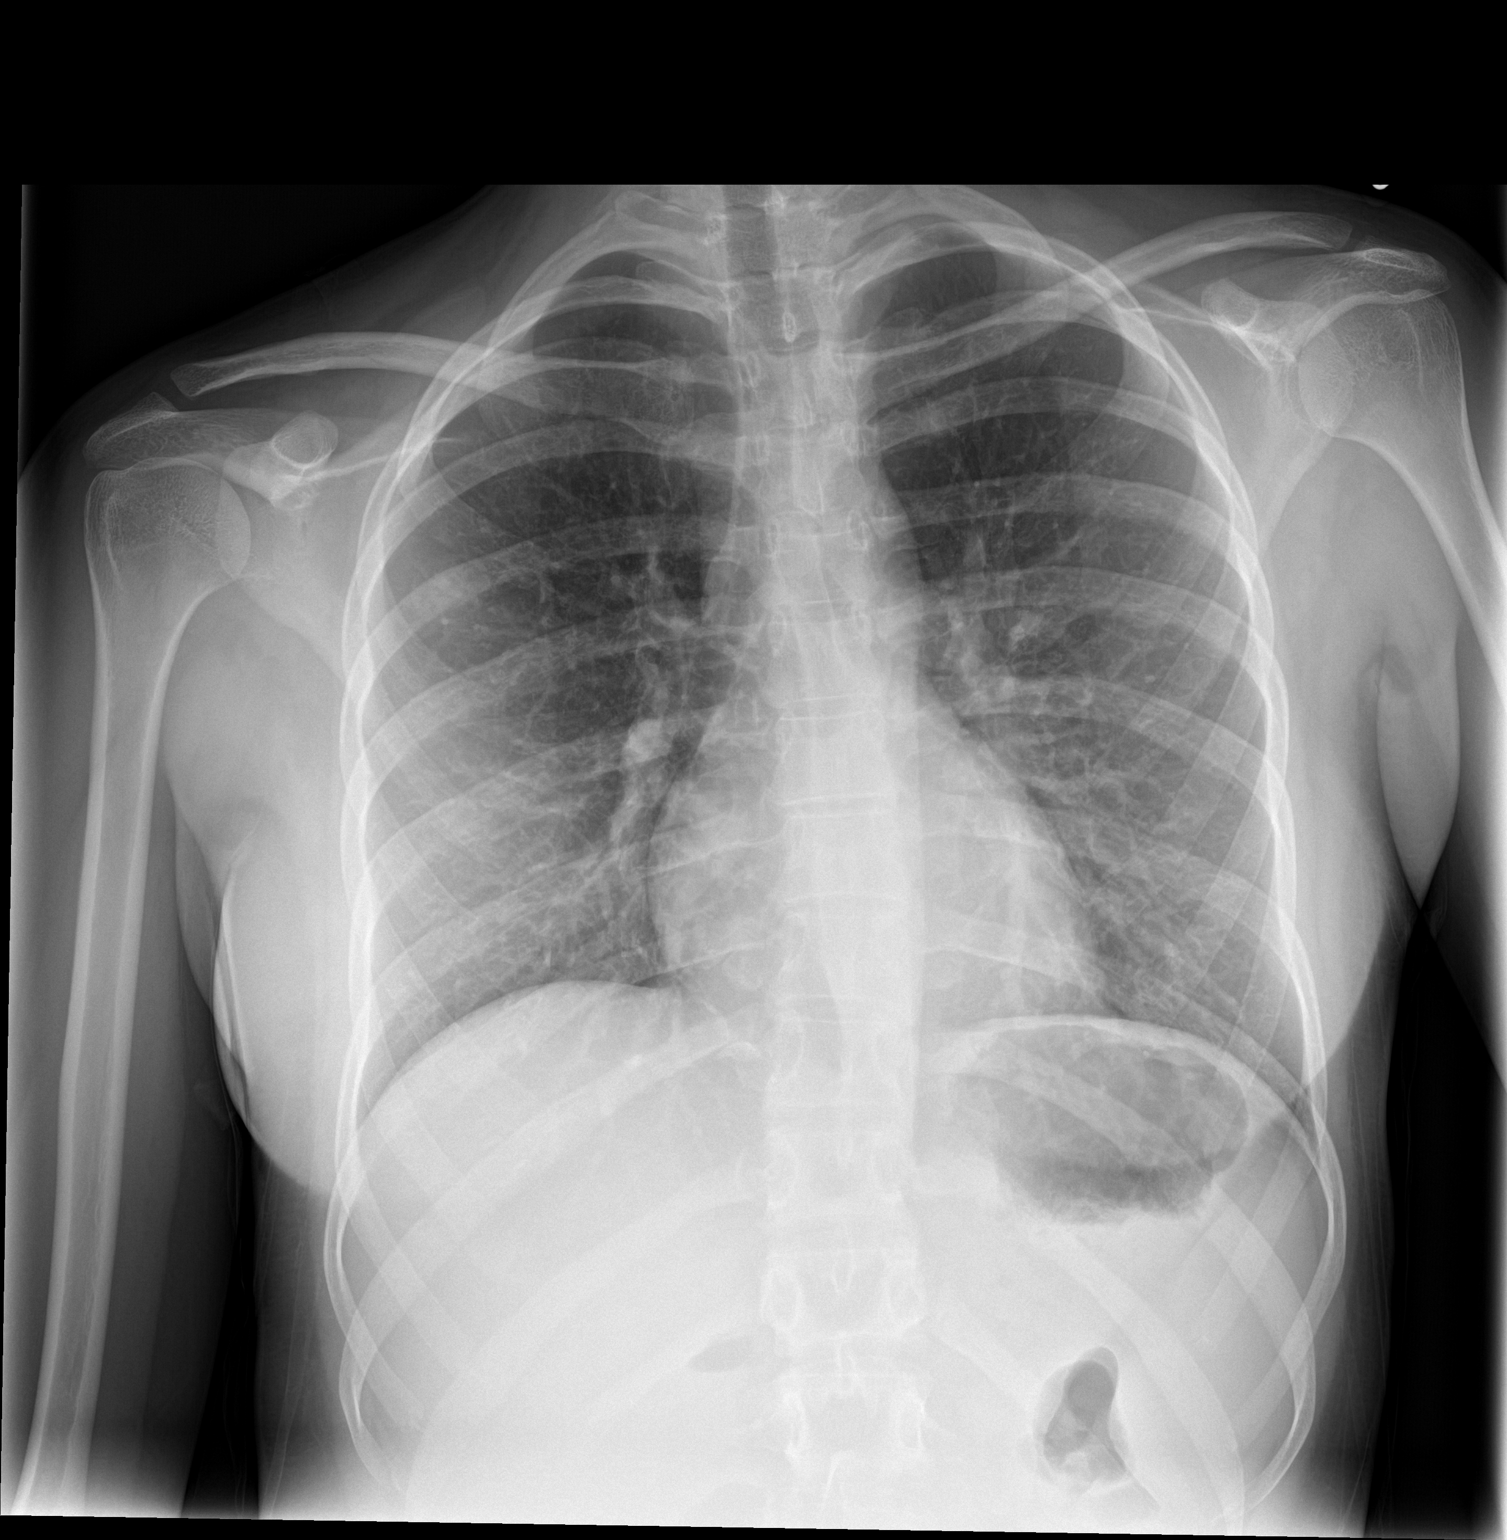

[chest lat]
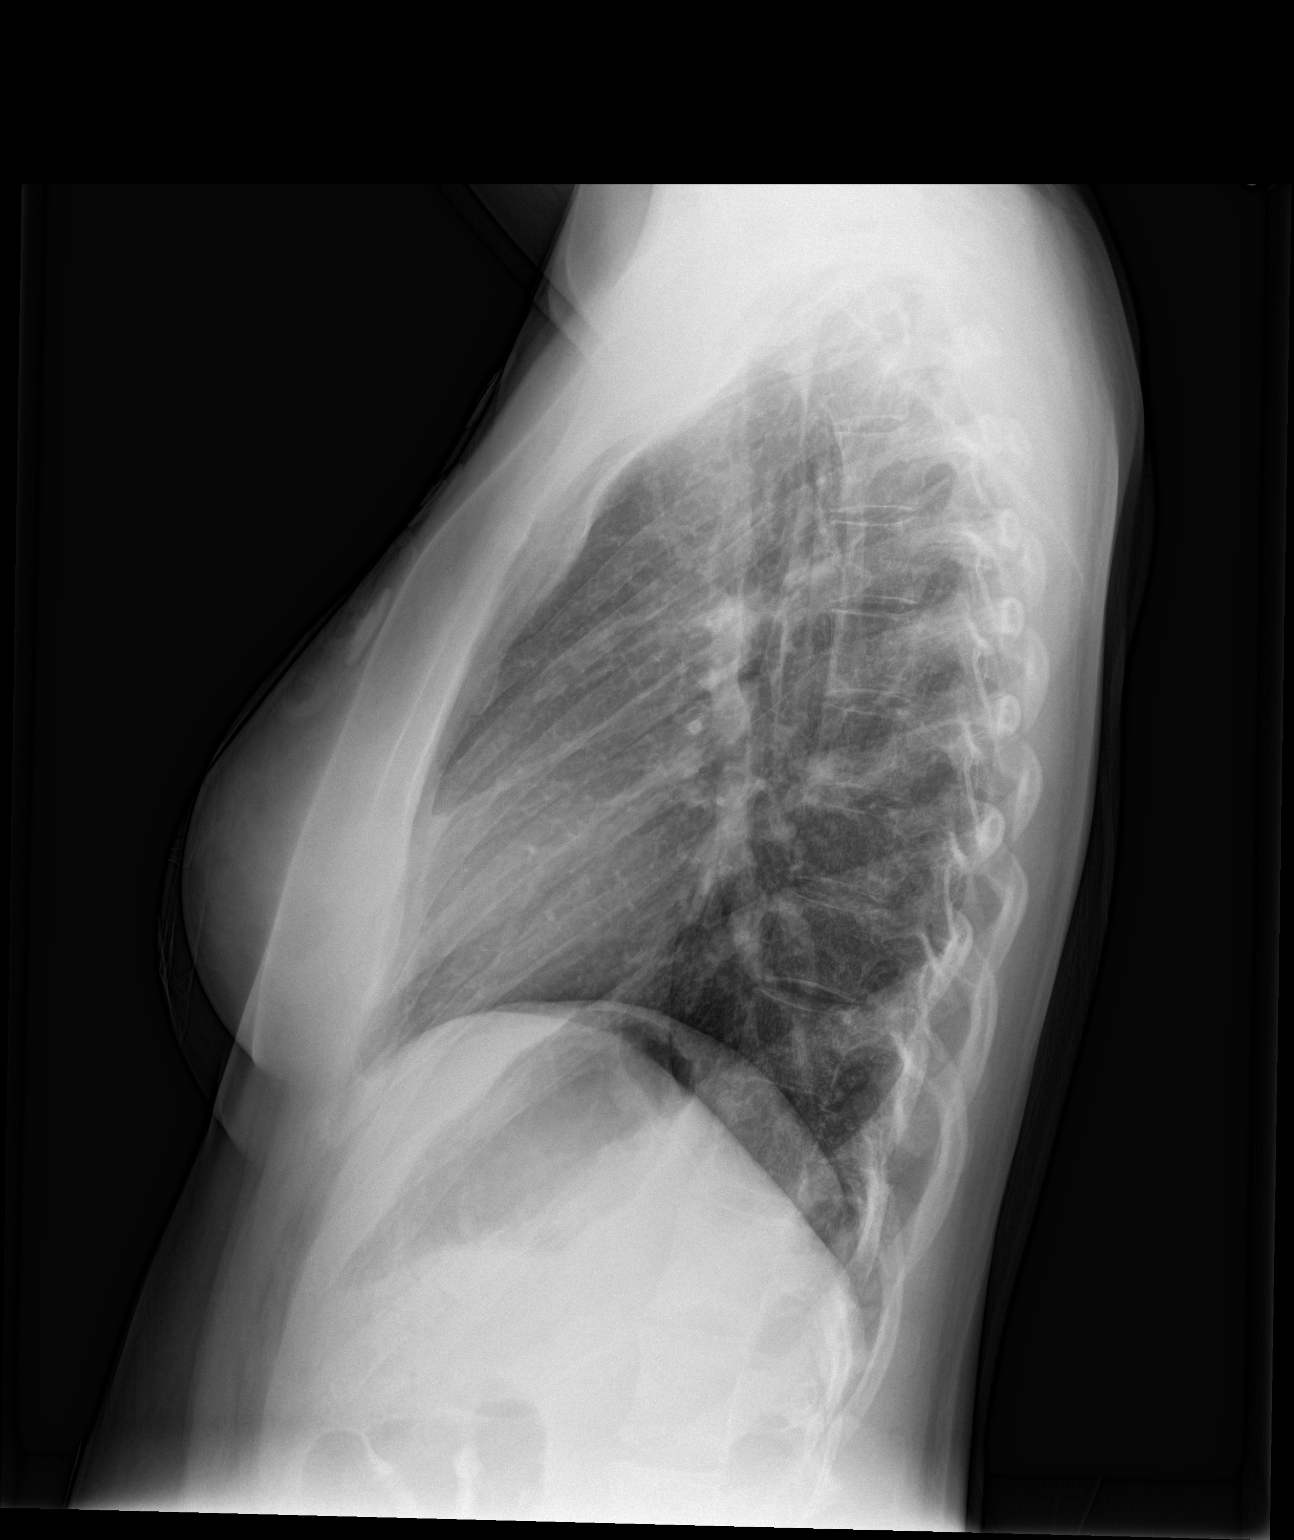

[2 of 2 positions shown; findings below may reference images not displayed]

FINDINGS: Lungs are clear. Heart size and pulmonary vascularity are normal. No
adenopathy. No bone lesions.
IMPRESSION: No edema or consolidation.

## 2017-08-06 IMAGING — US US ABDOMEN LIMITED
1 series · 13 of 13 positions shown · non-contrast
Comparison: None.

CLINICAL DATA: Left upper quadrant pain. Possible mononucleosis.
Evaluate spleen.

EXAM:
LIMITED ABDOMINAL ULTRASOUND

[Series 1: us abdomen limited · 0.22mm/px · 13 of 13 slices shown]
[im 1/13]
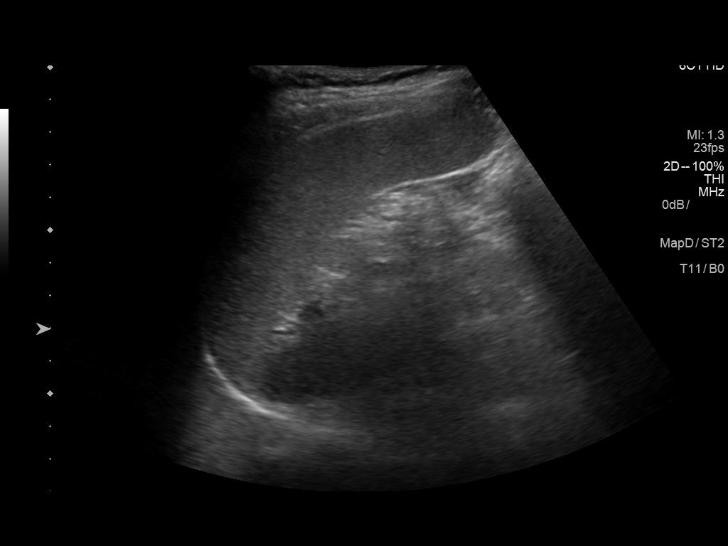
[im 2/13]
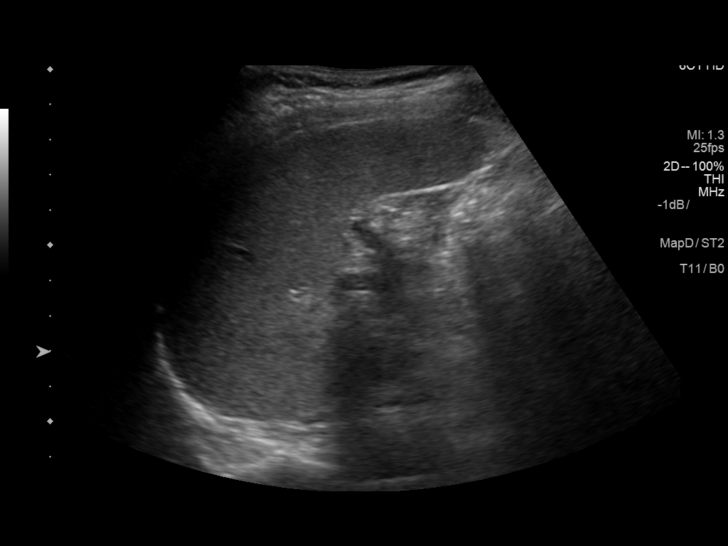
[im 3/13]
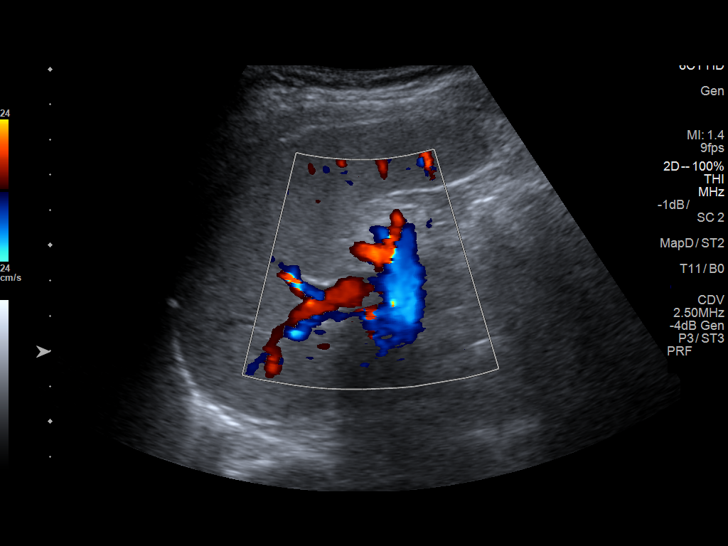
[im 4/13]
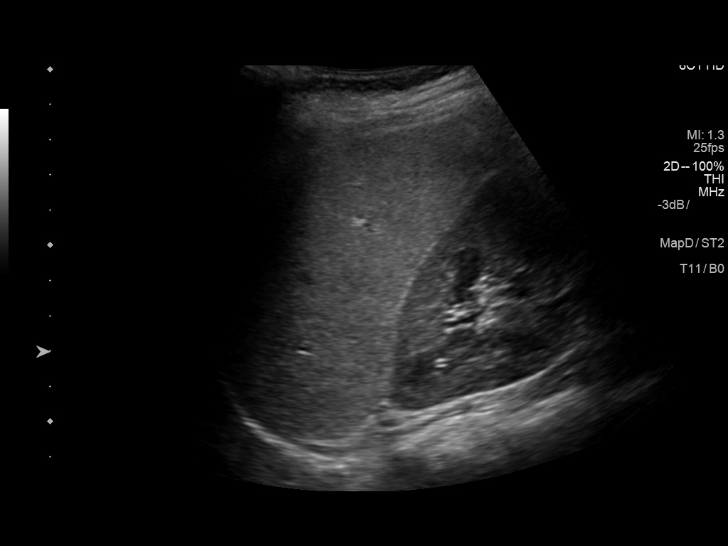
[im 5/13]
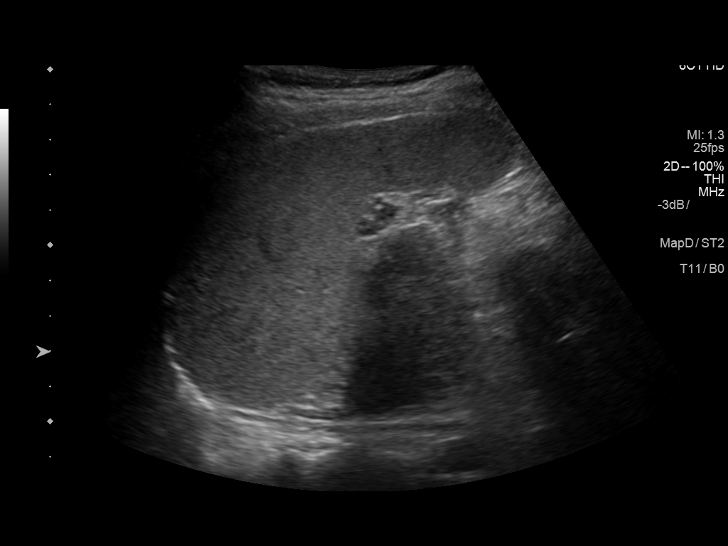
[im 6/13]
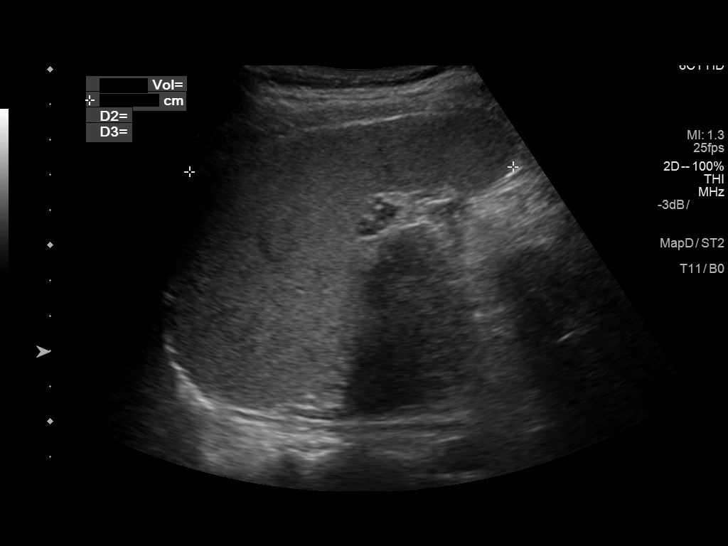
[im 7/13]
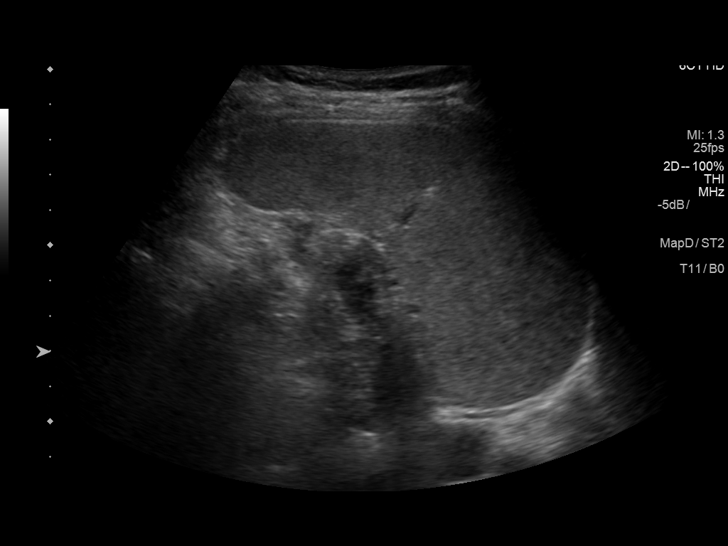
[im 8/13]
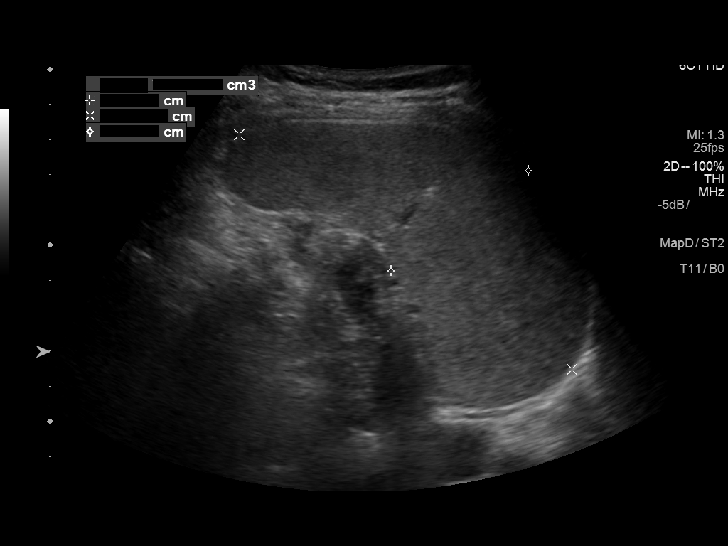
[im 9/13]
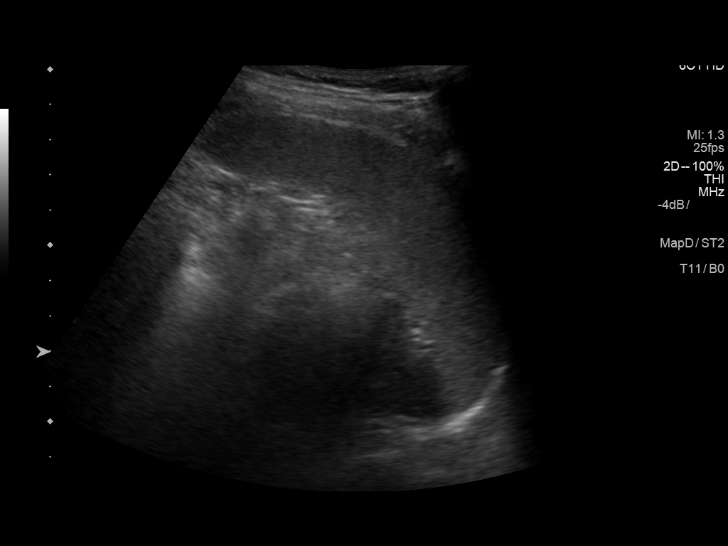
[im 10/13]
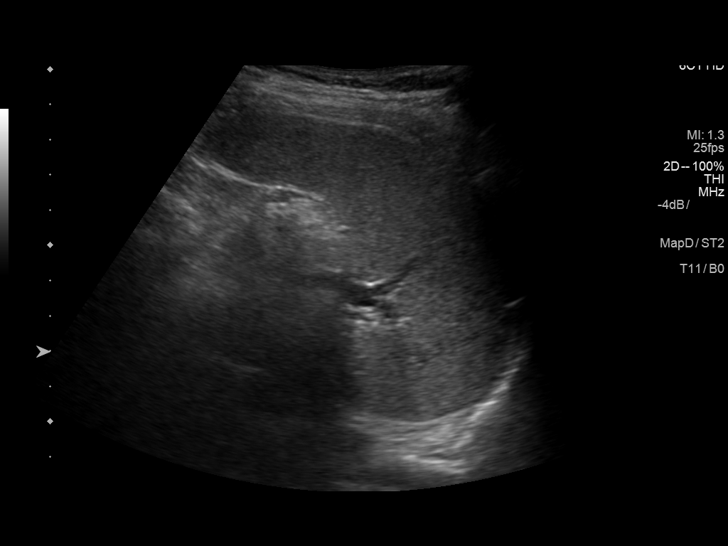
[im 11/13]
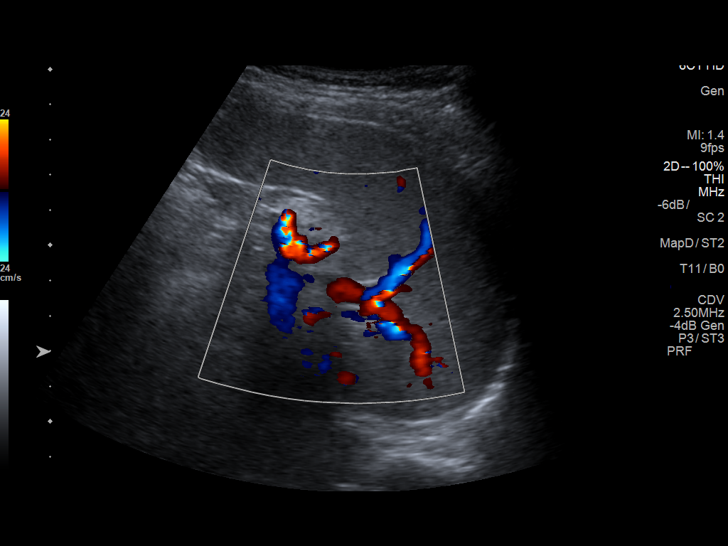
[im 12/13]
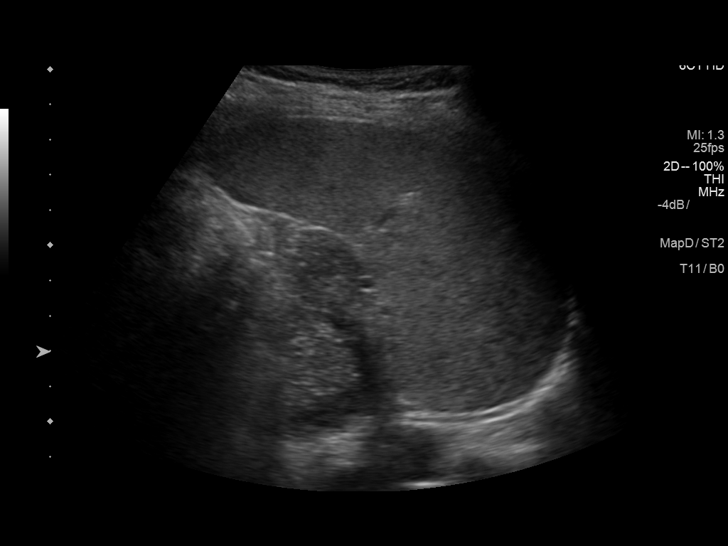
[im 13/13]
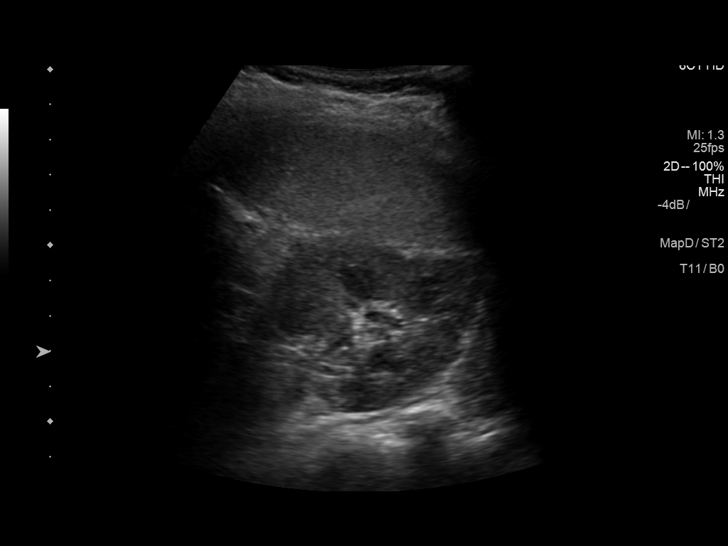

[13 of 13 positions shown; findings below may reference images not displayed]

FINDINGS: Splenic size is 9 x 12 x 5 cm for a 267 cc volume. There is no focal
abnormality or surrounding fluid. Normal hilar Doppler flow.
IMPRESSION: Normal spleen.
